# Patient Record
Sex: Female | Born: 1980 | Race: White | Hispanic: No | Marital: Married
Health system: Southern US, Community
[De-identification: ages and names within clinical notes are randomized; demographics above are authoritative.]

---

## 1999-06-06 ENCOUNTER — Other Ambulatory Visit: Admission: RE | Admit: 1999-06-06 | Discharge: 1999-06-06 | Payer: Self-pay | Admitting: Obstetrics and Gynecology

## 2000-09-23 ENCOUNTER — Other Ambulatory Visit: Admission: RE | Admit: 2000-09-23 | Discharge: 2000-09-23 | Payer: Self-pay | Admitting: Obstetrics and Gynecology

## 2001-12-11 ENCOUNTER — Other Ambulatory Visit: Admission: RE | Admit: 2001-12-11 | Discharge: 2001-12-11 | Payer: Self-pay | Admitting: Obstetrics & Gynecology

## 2002-12-29 ENCOUNTER — Other Ambulatory Visit: Admission: RE | Admit: 2002-12-29 | Discharge: 2002-12-29 | Payer: Self-pay | Admitting: Obstetrics and Gynecology

## 2004-02-21 ENCOUNTER — Other Ambulatory Visit: Admission: RE | Admit: 2004-02-21 | Discharge: 2004-02-21 | Payer: Self-pay | Admitting: Obstetrics and Gynecology

## 2004-10-19 ENCOUNTER — Inpatient Hospital Stay (HOSPITAL_COMMUNITY): Admission: AD | Admit: 2004-10-19 | Discharge: 2004-10-22 | Payer: Self-pay | Admitting: Obstetrics and Gynecology

## 2004-10-25 ENCOUNTER — Inpatient Hospital Stay (HOSPITAL_COMMUNITY): Admission: AD | Admit: 2004-10-25 | Discharge: 2004-10-27 | Payer: Self-pay | Admitting: Obstetrics and Gynecology

## 2009-06-16 ENCOUNTER — Inpatient Hospital Stay (HOSPITAL_COMMUNITY): Admission: AD | Admit: 2009-06-16 | Discharge: 2009-06-18 | Payer: Self-pay | Admitting: Obstetrics and Gynecology

## 2010-07-11 LAB — CBC
HCT: 34.1 % — ABNORMAL LOW (ref 36.0–46.0)
HCT: 37 % (ref 36.0–46.0)
Hemoglobin: 11.5 g/dL — ABNORMAL LOW (ref 12.0–15.0)
MCHC: 33.8 g/dL (ref 30.0–36.0)
MCHC: 35.1 g/dL (ref 30.0–36.0)
MCV: 87.6 fL (ref 78.0–100.0)
RDW: 13.5 % (ref 11.5–15.5)
RDW: 13.7 % (ref 11.5–15.5)
WBC: 13.2 10*3/uL — ABNORMAL HIGH (ref 4.0–10.5)

## 2010-09-07 NOTE — Discharge Summary (Signed)
NAME:  Gail Bell, Gail Bell NO.:  [id number]   MEDICAL RECORD NO.:  [id number]          PATIENT TYPE:  INP   LOCATION:  9317                          FACILITY:  [facility]   PHYSICIAN:  Ilda Mori, M.D.   DATE OF BIRTH:  [birth year]   DATE OF ADMISSION:  10/25/2004  DATE OF DISCHARGE:  10/27/2004                                 DISCHARGE SUMMARY   FINAL DIAGNOSIS:  Postpartum fever, possible endometritis.   SECONDARY DIAGNOSIS:  None.   OPERATION/PROCEDURE:  IV antibiotics.   COMPLICATIONS:  None.   CONDITION ON DISCHARGE:  Improved.   HISTORY OF PRESENT ILLNESS:  This is a 30 year old gravida 1, para 1, who  presented for evaluation with a fever of 103.3.  The patient was evaluated  and felt to have signs of a pelvic infection.  The patient is five days  status post spontaneous vaginal delivery which went without complications.  The patient was admitted by Dr. Carrington Clamp with a white count of  20,000 and started on antibiotics, Unasyn 3 g IV q.6h.  An ultrasound was  performed which revealed a normal-appearing postpartum uterus, normal  appearance of both ovaries.  No evidence of pelvic mass or fluid collection.  The patient slowly responded to IV antibiotics over the hospitalization. On  the morning of the third hospital day, the patient was feeling much better.  T-max was 100.4, but she had remained afebrile for most of the hospital day  #2 and into hospital day #3.  Her pulse at that point was 80.  She was  having no other symptoms. She was felt to be ready for discharge.  She was,  therefore, discharged on a regular diet, told to refrain from intercourse.  She was given Augmentin 875 mg to take twice a day for three days and the  return to our office in three weeks for followup evaluation.   LABORATORY DATA:  Admission white count 20,600, hemoglobin 11.3.  On  hospital day #1, her white count had dropped to 14,800, hemoglobin also  fallen slightly to 10.0.   Blood cultures have been obtained and showed no  growth.  A complete metabolic profile was obtained and was within normal  limits.  Ultrasound was performed as dictated earlier and was normal.      Ilda Mori, M.D.  Electronically Signed     RK/MEDQ  D:  12/05/2004  T:  12/06/2004  Job:  [id number]

## 2019-03-26 ENCOUNTER — Other Ambulatory Visit: Payer: Self-pay

## 2019-03-26 DIAGNOSIS — Z20822 Contact with and (suspected) exposure to covid-19: Secondary | ICD-10-CM

## 2019-03-29 LAB — NOVEL CORONAVIRUS, NAA: SARS-CoV-2, NAA: NOT DETECTED

## 2019-03-31 ENCOUNTER — Telehealth: Payer: Self-pay

## 2019-03-31 NOTE — Telephone Encounter (Signed)
Caller given negative result and verbalized understanding  

## 2020-01-07 ENCOUNTER — Encounter: Payer: Self-pay | Admitting: Internal Medicine

## 2020-02-28 ENCOUNTER — Ambulatory Visit: Payer: Self-pay | Admitting: Internal Medicine

## 2020-07-25 ENCOUNTER — Other Ambulatory Visit: Payer: Self-pay | Admitting: Obstetrics and Gynecology

## 2020-07-25 DIAGNOSIS — R928 Other abnormal and inconclusive findings on diagnostic imaging of breast: Secondary | ICD-10-CM

## 2020-08-16 ENCOUNTER — Ambulatory Visit
Admission: RE | Admit: 2020-08-16 | Discharge: 2020-08-16 | Disposition: A | Discharge status: Home / Self Care | Payer: BC Managed Care – PPO | Source: Ambulatory Visit | Attending: Obstetrics and Gynecology | Admitting: Obstetrics and Gynecology

## 2020-08-16 ENCOUNTER — Other Ambulatory Visit: Payer: Self-pay

## 2020-08-16 DIAGNOSIS — R928 Other abnormal and inconclusive findings on diagnostic imaging of breast: Secondary | ICD-10-CM

## 2022-04-01 IMAGING — MG DIGITAL DIAGNOSTIC BILAT W/ TOMO W/ CAD
8 series · 8 of 24 positions shown · non-contrast
Comparison: Previous exam(s).

CLINICAL DATA: Patient returns after screening study for evaluation
of possible bilateral breast masses.



[L MLO synth-2D]
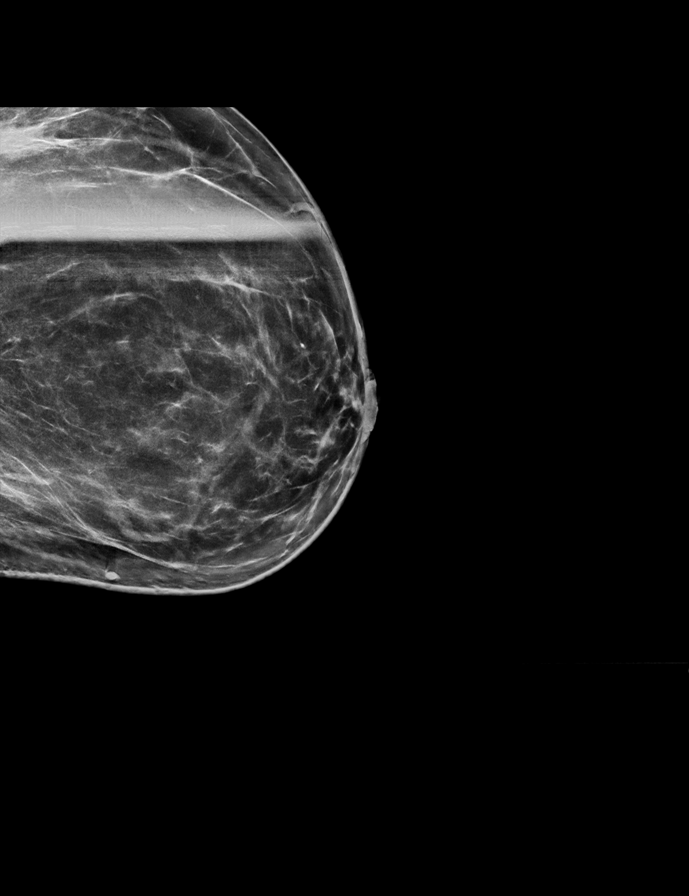

[R CC synth-2D]
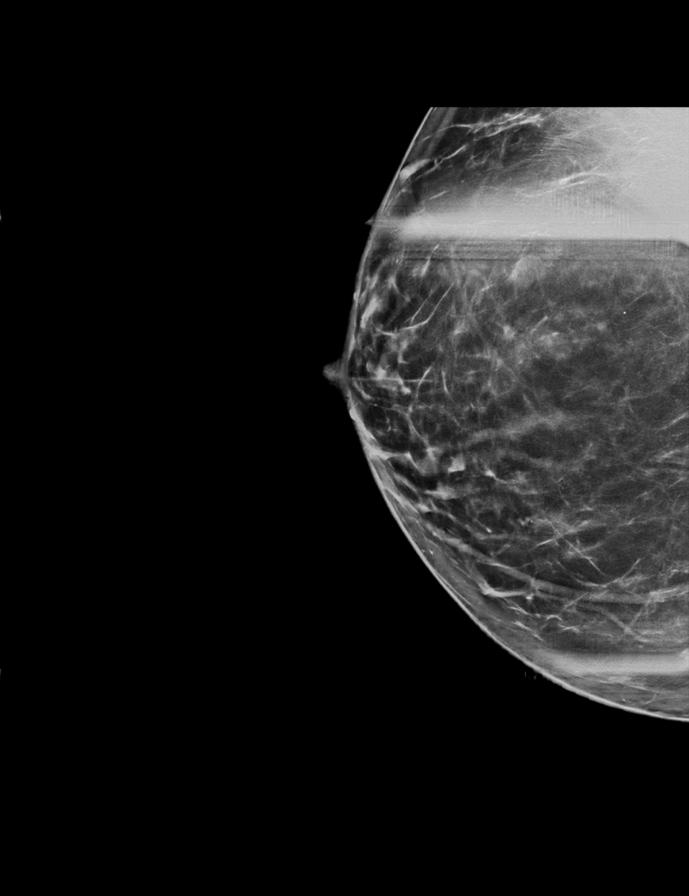

[R ML synth-2D]
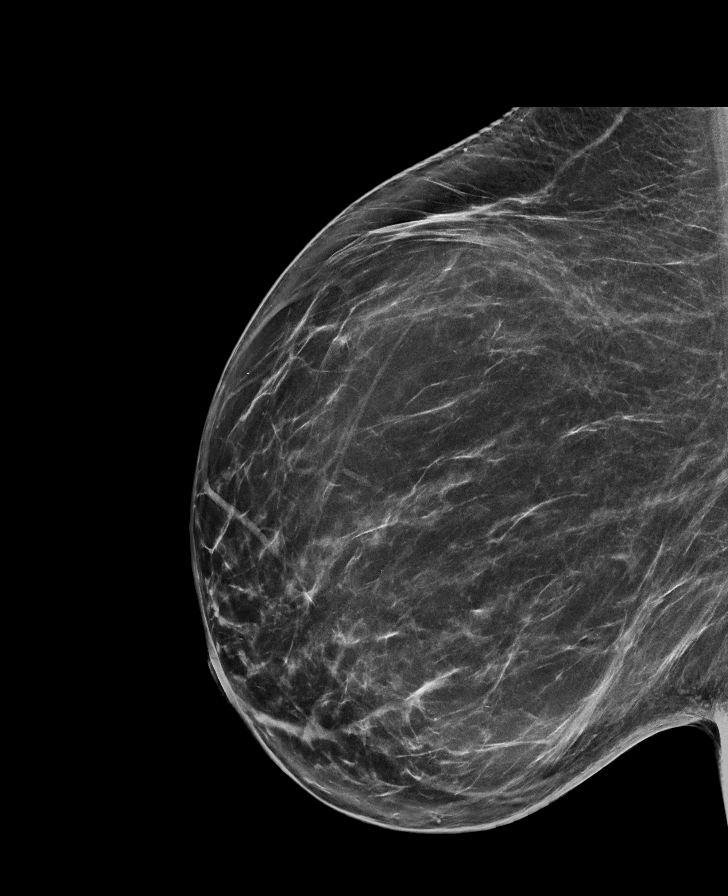

[L CC synth-2D]
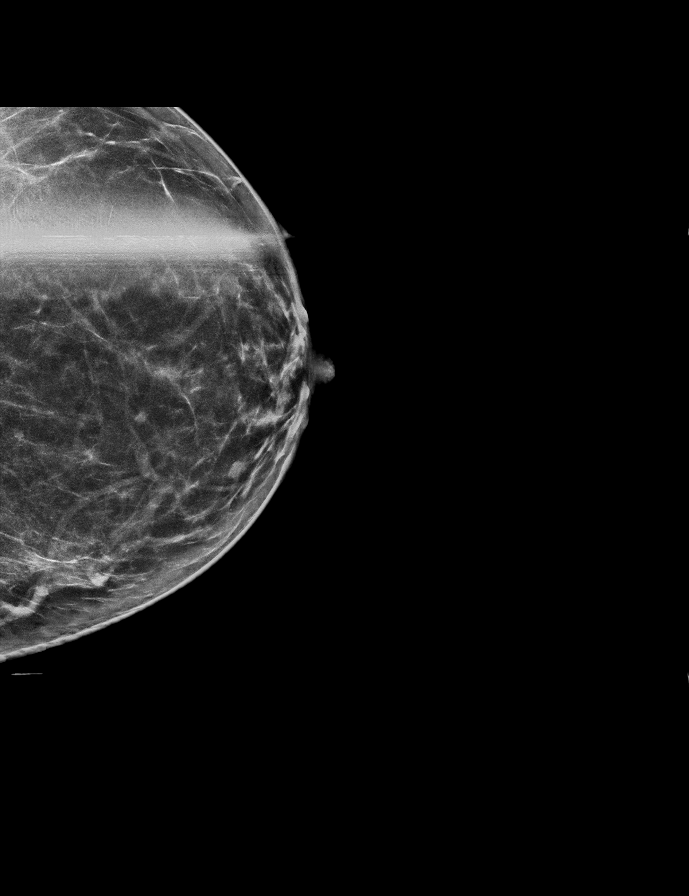

[L MLO tomo · tomo slice 37/74.0]
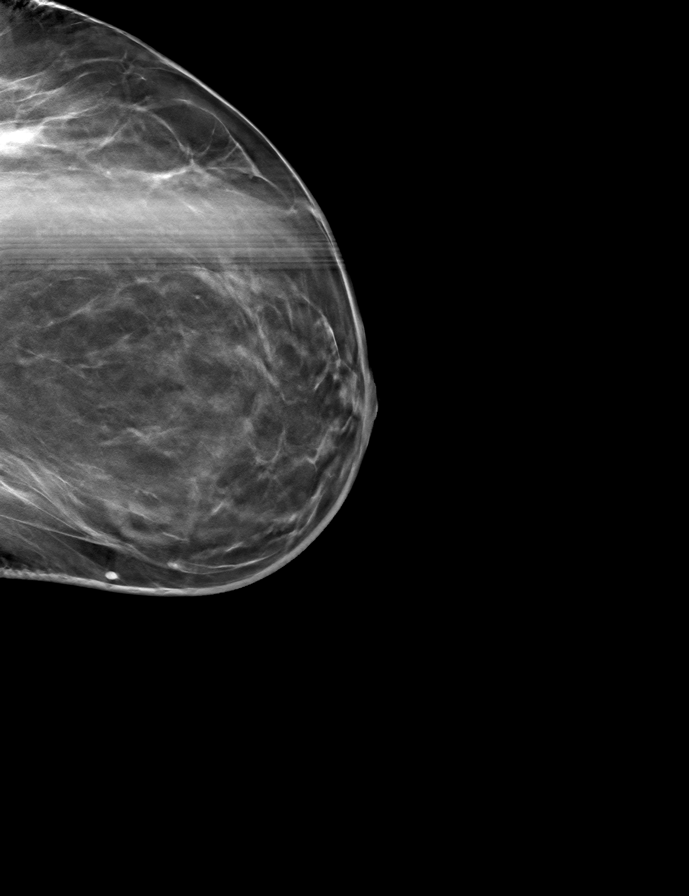

[R ML tomo · tomo slice 41/82.0]
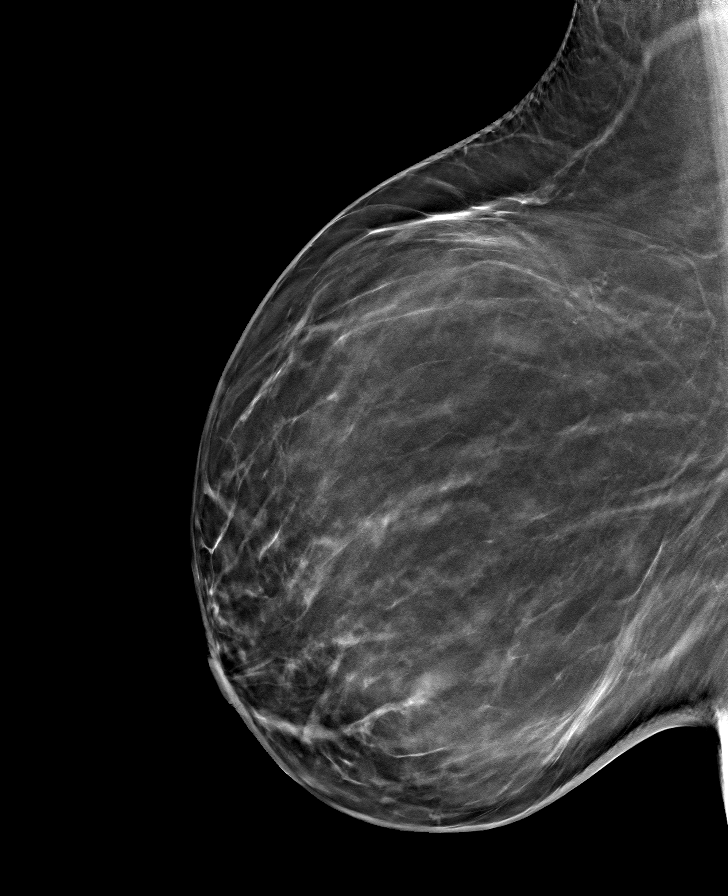

[R CC tomo · tomo slice 38/75.0]
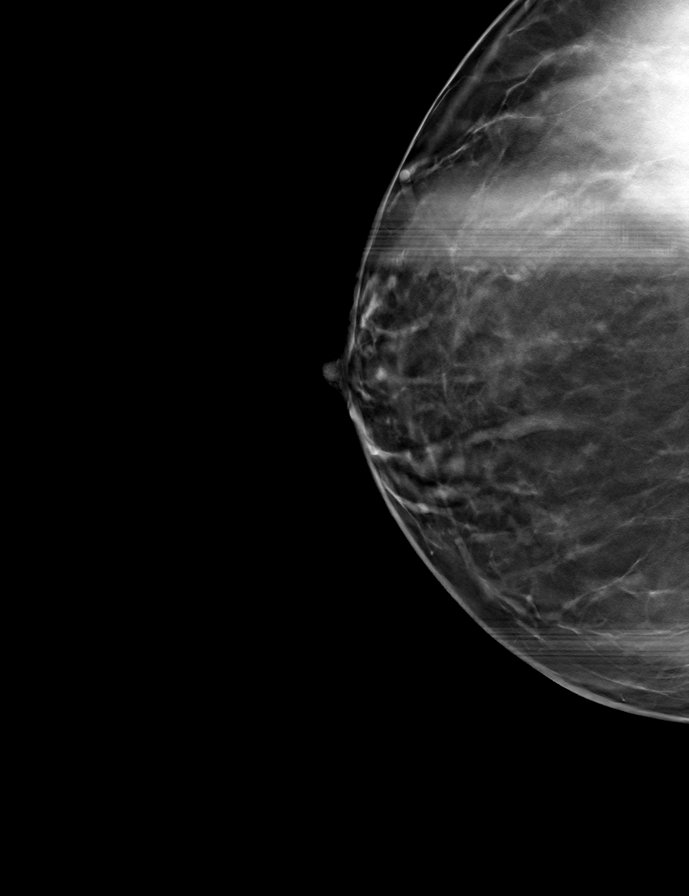

[L CC tomo · tomo slice 33/64.0]
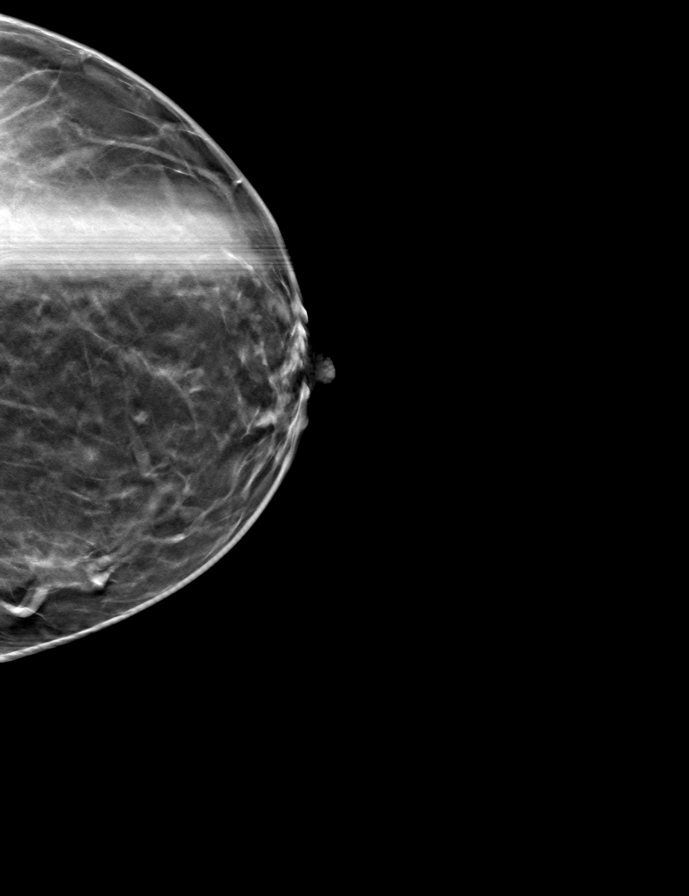

[8 of 24 positions shown; findings below may reference images not displayed]

ACR Breast Density Category b: There are scattered areas of
fibroglandular density.
FINDINGS: RIGHT BREAST:

Mammogram: Additional 2-D and 3-D images are performed. These views
show persistent tubular mass in the MEDIAL portion of the RIGHT
breast. Mammographic images were processed with CAD.

Ultrasound: Targeted ultrasound is performed, showing a minimally
ectatic duct in the 3 o'clock retroareolar region of the RIGHT
breast. No intraductal mass identified.

LEFT BREAST:

Mammogram: Additional 2-D and 3-D images are performed. These views
confirm presence of a circumscribed oval mass in the LOWER INNER
QUADRANT of the LEFT breast and further evaluated with ultrasound.
Mammographic images were processed with CAD.

Ultrasound: Targeted ultrasound is performed, showing a simple cyst
in the 7 o'clock location of the LEFT breast 1 centimeter from the
nipple measuring 0.5 x 0.2 x 0.4 centimeters. No solid mass or areas
of acoustic shadowing identified.
IMPRESSION: 1. Minimally ectatic benign retroareolar RIGHT breast ducts.
2. Simple cyst in the LEFT breast 7 o'clock location.
3.  No mammographic or ultrasound evidence for malignancy.

RECOMMENDATION:
Screening mammogram in one year.(Code:[id number])

I have discussed the findings and recommendations with the patient.
If applicable, a reminder letter will be sent to the patient
regarding the next appointment.

BI-RADS CATEGORY  2: Benign.

## 2023-02-18 ENCOUNTER — Other Ambulatory Visit: Payer: Self-pay | Admitting: Obstetrics and Gynecology

## 2023-02-18 DIAGNOSIS — R928 Other abnormal and inconclusive findings on diagnostic imaging of breast: Secondary | ICD-10-CM

## 2023-03-10 ENCOUNTER — Ambulatory Visit
Admission: RE | Admit: 2023-03-10 | Discharge: 2023-03-10 | Disposition: A | Discharge status: Home / Self Care | Payer: BC Managed Care – PPO | Source: Ambulatory Visit | Attending: Obstetrics and Gynecology | Admitting: Obstetrics and Gynecology

## 2023-03-10 DIAGNOSIS — R928 Other abnormal and inconclusive findings on diagnostic imaging of breast: Secondary | ICD-10-CM
